# Patient Record
Sex: Female | Born: 1970 | Race: White | Hispanic: No | Marital: Married | Smoking: Never smoker
Health system: Southern US, Community
[De-identification: ages and names within clinical notes are randomized; demographics above are authoritative.]

## PROBLEM LIST (undated history)

## (undated) DIAGNOSIS — J45909 Unspecified asthma, uncomplicated: Secondary | ICD-10-CM

## (undated) DIAGNOSIS — J302 Other seasonal allergic rhinitis: Secondary | ICD-10-CM

## (undated) DIAGNOSIS — R55 Syncope and collapse: Secondary | ICD-10-CM

## (undated) DIAGNOSIS — I456 Pre-excitation syndrome: Secondary | ICD-10-CM

## (undated) DIAGNOSIS — R0600 Dyspnea, unspecified: Secondary | ICD-10-CM

## (undated) DIAGNOSIS — E039 Hypothyroidism, unspecified: Secondary | ICD-10-CM

## (undated) DIAGNOSIS — R600 Localized edema: Secondary | ICD-10-CM

## (undated) DIAGNOSIS — E079 Disorder of thyroid, unspecified: Secondary | ICD-10-CM

## (undated) DIAGNOSIS — G8929 Other chronic pain: Secondary | ICD-10-CM

## (undated) DIAGNOSIS — R0602 Shortness of breath: Secondary | ICD-10-CM

## (undated) DIAGNOSIS — R42 Dizziness and giddiness: Secondary | ICD-10-CM

## (undated) DIAGNOSIS — R51 Headache: Secondary | ICD-10-CM

## (undated) DIAGNOSIS — R609 Edema, unspecified: Secondary | ICD-10-CM

## (undated) DIAGNOSIS — R5383 Other fatigue: Secondary | ICD-10-CM

## (undated) DIAGNOSIS — R000 Tachycardia, unspecified: Secondary | ICD-10-CM

## (undated) DIAGNOSIS — R002 Palpitations: Secondary | ICD-10-CM

## (undated) DIAGNOSIS — I10 Essential (primary) hypertension: Secondary | ICD-10-CM

## (undated) HISTORY — PX: TONSILLECTOMY: SUR1361

## (undated) HISTORY — DX: Dizziness and giddiness: R42

## (undated) HISTORY — DX: Pre-excitation syndrome: I45.6

## (undated) HISTORY — DX: Other chronic pain: G89.29

## (undated) HISTORY — DX: Dyspnea, unspecified: R06.00

## (undated) HISTORY — DX: Headache: R51

## (undated) HISTORY — PX: WISDOM TOOTH EXTRACTION: SHX21

## (undated) HISTORY — DX: Edema, unspecified: R60.9

## (undated) HISTORY — DX: Other fatigue: R53.83

## (undated) HISTORY — DX: Syncope and collapse: R55

## (undated) HISTORY — DX: Localized edema: R60.0

## (undated) HISTORY — DX: Tachycardia, unspecified: R00.0

## (undated) HISTORY — DX: Other seasonal allergic rhinitis: J30.2

## (undated) HISTORY — DX: Hypothyroidism, unspecified: E03.9

## (undated) HISTORY — DX: Disorder of thyroid, unspecified: E07.9

## (undated) HISTORY — DX: Shortness of breath: R06.02

## (undated) HISTORY — PX: ANKLE SURGERY: SHX546

## (undated) HISTORY — DX: Palpitations: R00.2

## (undated) HISTORY — PX: FOOT SURGERY: SHX648

---

## 2009-09-05 ENCOUNTER — Ambulatory Visit: Payer: Self-pay | Admitting: Cardiovascular Disease

## 2009-09-15 ENCOUNTER — Telehealth: Payer: Self-pay | Admitting: Cardiovascular Disease

## 2009-09-29 ENCOUNTER — Encounter: Payer: Self-pay | Admitting: Cardiology

## 2009-09-30 ENCOUNTER — Ambulatory Visit: Payer: Self-pay

## 2009-09-30 ENCOUNTER — Ambulatory Visit (HOSPITAL_COMMUNITY): Admission: RE | Admit: 2009-09-30 | Discharge: 2009-09-30 | Payer: Self-pay | Admitting: Cardiovascular Disease

## 2009-09-30 ENCOUNTER — Ambulatory Visit: Payer: Self-pay | Admitting: Cardiology

## 2009-09-30 ENCOUNTER — Ambulatory Visit: Payer: Self-pay | Admitting: Internal Medicine

## 2009-09-30 ENCOUNTER — Encounter: Payer: Self-pay | Admitting: Cardiovascular Disease

## 2010-02-28 NOTE — Miscellaneous (Signed)
  Clinical Lists Changes  Observations: Added new observation of REFERRING MD: Lorine Bears, MD (09/30/2009 15:41)

## 2010-02-28 NOTE — Progress Notes (Signed)
  Phone Note Outgoing Call   Call placed by: Dessie Coma  LPN,  September 15, 2009 2:01 PM Call placed to: Patient Summary of Call: [facility] notified patient per Dr. Kirke Corin that labs were OK.

## 2010-02-28 NOTE — Miscellaneous (Signed)
  Clinical Lists Changes  Observations: Added new observation of PAST MED HX: Palpitations Dizziness Dyspnea and fatigue Question of history of WPW on EKG in the past with no arrhythmia seen during a nuclear study in 2007 ( PR interval was short but no clear delta wave) Hypothyroidism Edema (09/29/2009 15:26)       Past History:  Past Medical History: Palpitations Dizziness Dyspnea and fatigue Question of history of WPW on EKG in the past with no arrhythmia seen during a nuclear study in 2007 ( PR interval was short but no clear delta wave) Hypothyroidism Edema

## 2010-06-13 NOTE — Assessment & Plan Note (Signed)
[facility]                        [facility] OFFICE NOTE   Judy, Eaton                MRN:          [id number]  DATE:09/05/2009                            DOB:          1970/09/11    HISTORY:  Judy Eaton is a 40 year old female who is here today for  evaluation of palpitations and dizziness as well as mild dyspnea and  fatigue.  She has questionable history of Wolff-Parkinson-White pattern  on her EKG without having any previously documented arrhythmia.  This  was detected in 2007 during a nuclear stress test.  At that time, her  baseline EKG showed short PR interval, although there was no clear delta  wave at that time.  She had initial ST depression with exercise in the  inferior and anterolateral leads but these changes actually improved  with further continuation of her stress test.  Her stress test at that  time was not suggestive of any ischemia or infarct.  She is here today  for recent episodes of palpitations.  She had two episodes of fast  heartbeats where she felt to her heart was going very fast with  palpitations, feeling radiating to her neck.  This happened while she  was spending sometime with her friends.  She had more alcohol than the  usual and had also extra caffeine intake before the episode.  The  episode lasted for about 30 minutes and could not be relieved with the  vagal maneuvers.  She felt dizzy, but had no presyncope or syncope.  She  has been also having symptoms of generalized fatigue and occasional  dyspnea.  She has lost weight recently as she has been on special diet.   PAST MEDICAL HISTORY:  1. Questionable history of Wolff-Parkinson-White pattern on her EKG      without having the syndrome.  2. Hypothyroidism.  3. Lower extremity edema for which she takes a diuretic.  She does not      have history of hypertension.   MEDICATIONS:  1. Yasmin  2. Singulair.  3. Synthroid 150 mcg  once daily.  4. Triamterene/hydrochlorothiazide 37.5/25 mg once daily.   ALLERGIES:  No known drug allergies.   SOCIAL HISTORY:  Negative for smoking or recreational drug use.  She  drinks alcohol occasionally.  Usually three drinks per week on weekends.  However, recently she had extra drinks during celebration of her  birthday.  She exercises 3-4 times a week.  She works as a Event organiser.   PAST SURGICAL HISTORY:  1. C-section x2.  2. Tonsillectomy.   FAMILY HISTORY:  She is not sure exactly about her family history, but  there is no reported premature coronary artery disease or arrhythmia.   REVIEW OF SYSTEMS:  Remarkable for dyspnea, fatigue, palpitations, and  dizziness.  Full review of system was performed and is otherwise  negative.   PHYSICAL EXAMINATION:  GENERAL:  She is pleasant and in no acute  distress.  VITAL SIGNS:  Weight is 191.4 pounds, blood pressure is 124/79, pulse is  64, oxygen saturation is 96% on room air.  NECK:  No JVD or carotid bruits.  There is no thyromegaly or masses.  RESPIRATORY:  Normal respiratory effort with no use of accessory  muscles.  Auscultation reveals normal breath sounds with no crackles or  wheezing.  CARDIOVASCULAR:  Normal PMI.  Normal S1 and S2 with no gallops or  murmurs.  ABDOMEN:  Benign, nontender, nondistended.  EXTREMITIES:  With no clubbing, cyanosis, or edema.   Electrocardiogram was done which showed sinus rhythm with borderline  short PR interval.  QT interval is normal.  I do not see clear signs of  delta waves.   IMPRESSION:  Palpitations which happened twice recently.  The history of  suggestive of possible supraventricular tachycardia which might have  been triggered by excessive alcohol and caffeine intake.  Generally her  episodes are rare and most likely will not be able to catch these on a  Holter monitor.  She is also having generalized symptoms of fatigue and  occasional dyspnea and not  feeling well.  I think it will be important  to rule out some underlying metabolic abnormalities.  Thus I recommended  routine laboratory work which includes CBC, CMP, and checking thyroid  function.  Although I do not see clear evidence of Wolff-Parkinson-White  pattern on her EKG.  I reviewed the old EKGs as well and it is not  convincing.  She does have a short PR interval and occasionally a delta  wave is only present on a certain heart rate.  Due to this and due to  her symptoms of shortness of breath, I will recommend a treadmill stress  test to evaluate heart rate response to exercise as well as to see if we  can unmask any delta wave at higher heart rate.  We will also obtain an  echocardiogram for further evaluation.  If she gets any further episodes  of tachycardia, will likely need a prolonged monitoring with a 30-day  monitor.  In the meantime, I advised her to cut down on caffeine and  alcohol intake.  She will be notified with results of the workup.     Lorine Bears, MD  Electronically Signed    MA/MedQ  DD: 09/05/2009  DT: 09/06/2009  Job #: [id number]

## 2010-08-28 ENCOUNTER — Encounter: Payer: Self-pay | Admitting: Cardiovascular Disease

## 2010-10-19 ENCOUNTER — Encounter: Payer: Self-pay | Admitting: Cardiovascular Disease

## 2010-12-06 ENCOUNTER — Encounter: Payer: Self-pay | Admitting: Cardiovascular Disease

## 2012-10-23 DIAGNOSIS — G5761 Lesion of plantar nerve, right lower limb: Secondary | ICD-10-CM | POA: Insufficient documentation

## 2012-10-23 DIAGNOSIS — M2040 Other hammer toe(s) (acquired), unspecified foot: Secondary | ICD-10-CM | POA: Insufficient documentation

## 2014-01-19 ENCOUNTER — Ambulatory Visit: Payer: Self-pay | Admitting: Physician Assistant

## 2014-01-19 LAB — RAPID STREP-A WITH REFLX: Micro Text Report: NEGATIVE

## 2014-01-22 LAB — BETA STREP CULTURE(ARMC)

## 2014-09-27 DIAGNOSIS — M1710 Unilateral primary osteoarthritis, unspecified knee: Secondary | ICD-10-CM | POA: Insufficient documentation

## 2014-12-09 ENCOUNTER — Encounter: Payer: Self-pay | Admitting: Emergency Medicine

## 2014-12-09 ENCOUNTER — Ambulatory Visit
Admission: EM | Admit: 2014-12-09 | Discharge: 2014-12-09 | Disposition: A | Discharge status: Home / Self Care | Payer: BLUE CROSS/BLUE SHIELD | Attending: Family Medicine | Admitting: Family Medicine

## 2014-12-09 DIAGNOSIS — R070 Pain in throat: Secondary | ICD-10-CM

## 2014-12-09 LAB — RAPID STREP SCREEN (MED CTR MEBANE ONLY): Streptococcus, Group A Screen (Direct): NEGATIVE

## 2014-12-09 MED ORDER — PREDNISONE 20 MG PO TABS: ORAL_TABLET | ORAL | Status: DC

## 2014-12-09 NOTE — ED Notes (Signed)
Patient c/o sore throat for 4 days.

## 2014-12-09 NOTE — ED Provider Notes (Signed)
CSN: [id number]     Arrival date & time 12/09/14  1617 History   First MD Initiated Contact with Patient 12/09/14 1644     Chief Complaint  Patient presents with  . Sore Throat   (Consider location/radiation/quality/duration/timing/severity/associated sxs/prior Treatment) HPI   This a 44 year old female who presents with a sore throat that she's had for 4 days. States that she has had no fever although she's been chilled has abstained no sinus issues. 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 Now she has pain when she swallows and she is also very tender in the sternocleidomastoid muscles. Does not Complain of any pain with neck motion. She further states that one of the children at the theater group did have a strep throat and she has had strep throat on many occasions in the past.  Past Medical History  Diagnosis Date  . Palpitations   . Dizziness   . Dyspnea   . Fatigue   . WPW (Wolff-Parkinson-White syndrome)     question of it on EKG in past no arrhythmia seen during a nuclear test in 2007. (PR interval was short but no clear delta wave)  . Hypothyroidism   . Edema   . Rapid heart rate   . Chronic headaches   . Lightheadedness   . SOB (shortness of breath)   . Dizziness   . Fainting   . Seasonal allergies     Hay fever  . Thyroid disease   . Lower extremity edema    Past Surgical History  Procedure Laterality Date  . Cesarean section  1999/2000    x2  . Tonsillectomy    . Wisdom tooth extraction     History reviewed. No pertinent family history. Social History  Substance Use Topics  . Smoking status: Never Smoker   . Smokeless tobacco: None  . Alcohol Use: 1.5 oz/week    3 drink(s) per week     Comment: Weekends   OB History    No data available     Review of Systems  Constitutional: Positive for chills. Negative for fever, diaphoresis,  activity change, appetite change and fatigue.  HENT: Positive for sore throat and trouble swallowing. Negative for congestion, dental problem, drooling, ear discharge, ear pain, facial swelling, sinus pressure and sneezing.   Respiratory: Negative for cough and shortness of breath.   All other systems reviewed and are negative.   Allergies  Review of patient's allergies indicates no known allergies.  Home Medications   Prior to Admission medications   Medication Sig Start Date End Date Taking? Authorizing Provider  Drospirenone-Ethinyl Estradiol (YASMIN 28 PO) Take by mouth.      Historical Provider, MD  levothyroxine (SYNTHROID, LEVOTHROID) 150 MCG tablet Take 150 mcg by mouth daily.      Historical Provider, MD  Montelukast Sodium (SINGULAIR PO) Take by mouth.      Historical Provider, MD  predniSONE (DELTASONE) 20 MG tablet Take 2 tablets (40 mg) daily by mouth 12/09/14   Lutricia FeilWilliam P Gor Vestal, PA-C  triamterene-hydrochlorothiazide (DYAZIDE) 37.5-25 MG per capsule Take 1 capsule by mouth every morning.      Historical Provider, MD   Meds Ordered and Administered this Visit  Medications - No data to display  BP 111/78 mmHg  Pulse 78  Temp(Src) 97.7 F (36.5 C) (Tympanic)  Resp 16  Ht 5\' 6"  (1.676 m)  Wt  220 lb (99.791 kg)  BMI 35.53 kg/m2  SpO2 100%  LMP 12/06/2014 (Exact Date) No data found.   Physical Exam  Constitutional: She is oriented to person, place, and time. She appears well-developed and well-nourished. No distress.  HENT:  Head: Normocephalic and atraumatic.  Right Ear: External ear normal.  Left Ear: External ear normal.  Nose: Nose normal.  Mouth/Throat: No oropharyngeal exudate.  Eyes: Pupils are equal, round, and reactive to light. Right eye exhibits no discharge. Left eye exhibits no discharge.  Neck: Normal range of motion. Neck supple. No tracheal deviation present. Thyromegaly present.  Patient has tenderness to palpation along  both cleidomastoid  muscles. No adenopathy is appreciated. Range of motion of her neck is normal and full.  Pulmonary/Chest: Breath sounds normal. No stridor. No respiratory distress. She has no wheezes. She has no rales.  Musculoskeletal: Normal range of motion. She exhibits no edema or tenderness.  Lymphadenopathy:    She has no cervical adenopathy.  Neurological: She is alert and oriented to person, place, and time.  Skin: Skin is warm and dry. She is not diaphoretic.  Psychiatric: She has a normal mood and affect. Her behavior is normal. Judgment and thought content normal.  Nursing note and vitals reviewed.   ED Course  Procedures (including critical care time)  Labs Review Labs Reviewed  RAPID STREP SCREEN (NOT AT [facility])  CULTURE, GROUP A STREP ([facility] ONLY)    Imaging Review No results found.   Visual Acuity Review  Right Eye Distance:   Left Eye Distance:   Bilateral Distance:    Right Eye Near:   Left Eye Near:    Bilateral Near:         MDM   1. Throat pain in adult    New Prescriptions   PREDNISONE (DELTASONE) 20 MG TABLET    Take 2 tablets (40 mg) daily by mouth   Plan: 1. Test/x-ray results and diagnosis reviewed with patient 2. rx as per orders; risks, benefits, potential side effects reviewed with patient 3. Recommend supportive treatment with salt water gargles.  4. F/u PCP if not improving. Call 48 hours for C&S results.  Lutricia Feil, PA-C 12/09/14 1736

## 2014-12-11 LAB — CULTURE, GROUP A STREP (THRC)

## 2014-12-13 NOTE — ED Notes (Signed)
Final report of testing negative for infection

## 2014-12-14 ENCOUNTER — Ambulatory Visit
Admission: EM | Admit: 2014-12-14 | Discharge: 2014-12-14 | Disposition: A | Discharge status: Home / Self Care | Payer: BLUE CROSS/BLUE SHIELD | Attending: Family Medicine | Admitting: Family Medicine

## 2014-12-14 ENCOUNTER — Ambulatory Visit (INDEPENDENT_AMBULATORY_CARE_PROVIDER_SITE_OTHER): Payer: BLUE CROSS/BLUE SHIELD

## 2014-12-14 DIAGNOSIS — R059 Cough, unspecified: Secondary | ICD-10-CM

## 2014-12-14 DIAGNOSIS — J0110 Acute frontal sinusitis, unspecified: Secondary | ICD-10-CM | POA: Diagnosis not present

## 2014-12-14 DIAGNOSIS — R05 Cough: Secondary | ICD-10-CM

## 2014-12-14 MED ORDER — AMOXICILLIN 875 MG PO TABS: 875.0000 mg | ORAL_TABLET | Freq: Two times a day (BID) | ORAL | Status: DC

## 2014-12-14 MED ORDER — GUAIFENESIN-CODEINE 100-10 MG/5ML PO SOLN: ORAL | Status: DC

## 2014-12-14 NOTE — ED Provider Notes (Signed)
CSN: [id number]     Arrival date & time 12/14/14  1719 History   First MD Initiated Contact with Patient 12/14/14 1857     Chief Complaint  Patient presents with  . URI   (Consider location/radiation/quality/duration/timing/severity/associated sxs/prior Treatment) Patient is a 44 y.o. female presenting with URI. The history is provided by the patient.  URI Presenting symptoms: congestion, cough, ear pain, facial pain, fever and rhinorrhea   Severity:  Moderate Onset quality:  Sudden Duration:  10 days Timing:  Constant Progression:  Worsening Chronicity:  New Relieved by:  Nothing Ineffective treatments:  OTC medications Associated symptoms: headaches and sinus pain   Associated symptoms: no arthralgias, no myalgias, no neck pain, no sneezing and no wheezing     Past Medical History  Diagnosis Date  . Palpitations   . Dizziness   . Dyspnea   . Fatigue   . WPW (Wolff-Parkinson-White syndrome)     question of it on EKG in past no arrhythmia seen during a nuclear test in 2007. (PR interval was short but no clear delta wave)  . Hypothyroidism   . Edema   . Rapid heart rate   . Chronic headaches   . Lightheadedness   . SOB (shortness of breath)   . Dizziness   . Fainting   . Seasonal allergies     Hay fever  . Thyroid disease   . Lower extremity edema    Past Surgical History  Procedure Laterality Date  . Cesarean section  1999/2000    x2  . Tonsillectomy    . Wisdom tooth extraction     History reviewed. No pertinent family history. Social History  Substance Use Topics  . Smoking status: Never Smoker   . Smokeless tobacco: None  . Alcohol Use: 1.5 oz/week    3 drink(s) per week     Comment: Weekends   OB History    No data available     Review of Systems  Constitutional: Positive for fever.  HENT: Positive for congestion, ear pain and rhinorrhea. Negative for sneezing.   Respiratory: Positive for cough. Negative for wheezing.   Musculoskeletal:  Negative for myalgias, arthralgias and neck pain.  Neurological: Positive for headaches.    Allergies  Review of patient's allergies indicates no known allergies.  Home Medications   Prior to Admission medications   Medication Sig Start Date End Date Taking? Authorizing Provider  DM-Doxylamine-Acetaminophen (NYQUIL COLD & FLU PO) Take by mouth.   Yes Historical Provider, MD  levothyroxine (SYNTHROID, LEVOTHROID) 150 MCG tablet Take 150 mcg by mouth daily.     Yes Historical Provider, MD  triamterene-hydrochlorothiazide (DYAZIDE) 37.5-25 MG per capsule Take 1 capsule by mouth every morning.     Yes Historical Provider, MD  amoxicillin (AMOXIL) 875 MG tablet Take 1 tablet (875 mg total) by mouth 2 (two) times daily. 12/14/14   Payton Mccallum, MD  Drospirenone-Ethinyl Estradiol (YASMIN 28 PO) Take by mouth.      Historical Provider, MD  guaiFENesin-codeine 100-10 MG/5ML syrup 5-10 ml po qhs prn cough 12/14/14   Payton Mccallum, MD  Montelukast Sodium (SINGULAIR PO) Take by mouth.      Historical Provider, MD  predniSONE (DELTASONE) 20 MG tablet Take 2 tablets (40 mg) daily by mouth 12/09/14   Lutricia Feil, PA-C   Meds Ordered and Administered this Visit  Medications - No data to display  BP 110/97 mmHg  Pulse 105  Temp(Src) 98.5 F (36.9 C) (Tympanic)  Resp 16  Ht  5\' 6"  (1.676 m)  Wt 220 lb (99.791 kg)  BMI 35.53 kg/m2  SpO2 97%  LMP 12/06/2014 (Approximate) No data found.   Physical Exam  ED Course  Procedures (including critical care time)  Labs Review Labs Reviewed - No data to display  Imaging Review Dg Chest 2 View  12/14/2014  CLINICAL DATA:  Productive cough. EXAM: CHEST  2 VIEW COMPARISON:  None. FINDINGS: Normal heart size. Normal mediastinal contour. No pneumothorax. No pleural effusion. Clear lungs, with no focal lung consolidation and no pulmonary edema. IMPRESSION: No active cardiopulmonary disease. Electronically Signed   By: Delbert PhenixJason A Poff M.D.   On:  12/14/2014 19:05     Visual Acuity Review  Right Eye Distance:   Left Eye Distance:   Bilateral Distance:    Right Eye Near:   Left Eye Near:    Bilateral Near:         MDM   1. Acute frontal sinusitis, recurrence not specified   2. Cough     Discharge Medication List as of 12/14/2014  8:38 PM    START taking these medications   Details  amoxicillin (AMOXIL) 875 MG tablet Take 1 tablet (875 mg total) by mouth 2 (two) times daily., Starting 12/14/2014, Until Discontinued, Print    guaiFENesin-codeine 100-10 MG/5ML syrup 5-10 ml po qhs prn cough, Print      1. x-ray results and diagnosis reviewed with patient 2. rx as per orders above; reviewed possible side effects, interactions, risks and benefits  3. Recommend supportive treatment with otc nasal steroid spray, otc analgesics prn 4. Follow-up prn if symptoms worsen or don't improve    Payton Mccallumrlando Royce Sciara, MD 12/14/14 2156

## 2014-12-14 NOTE — ED Notes (Addendum)
Started 1 1/2 weeks with sore throat. [facility] and given Rx for Prednisone. Continues to have laryngitis and sore throat, right ear pain and headache.

## 2017-01-06 IMAGING — CR DG CHEST 2V
3 series · 3 of 3 positions shown · non-contrast
Comparison: None.

CLINICAL DATA: Productive cough.

EXAM:
CHEST  2 VIEW

[chest pa (1 of 2)]
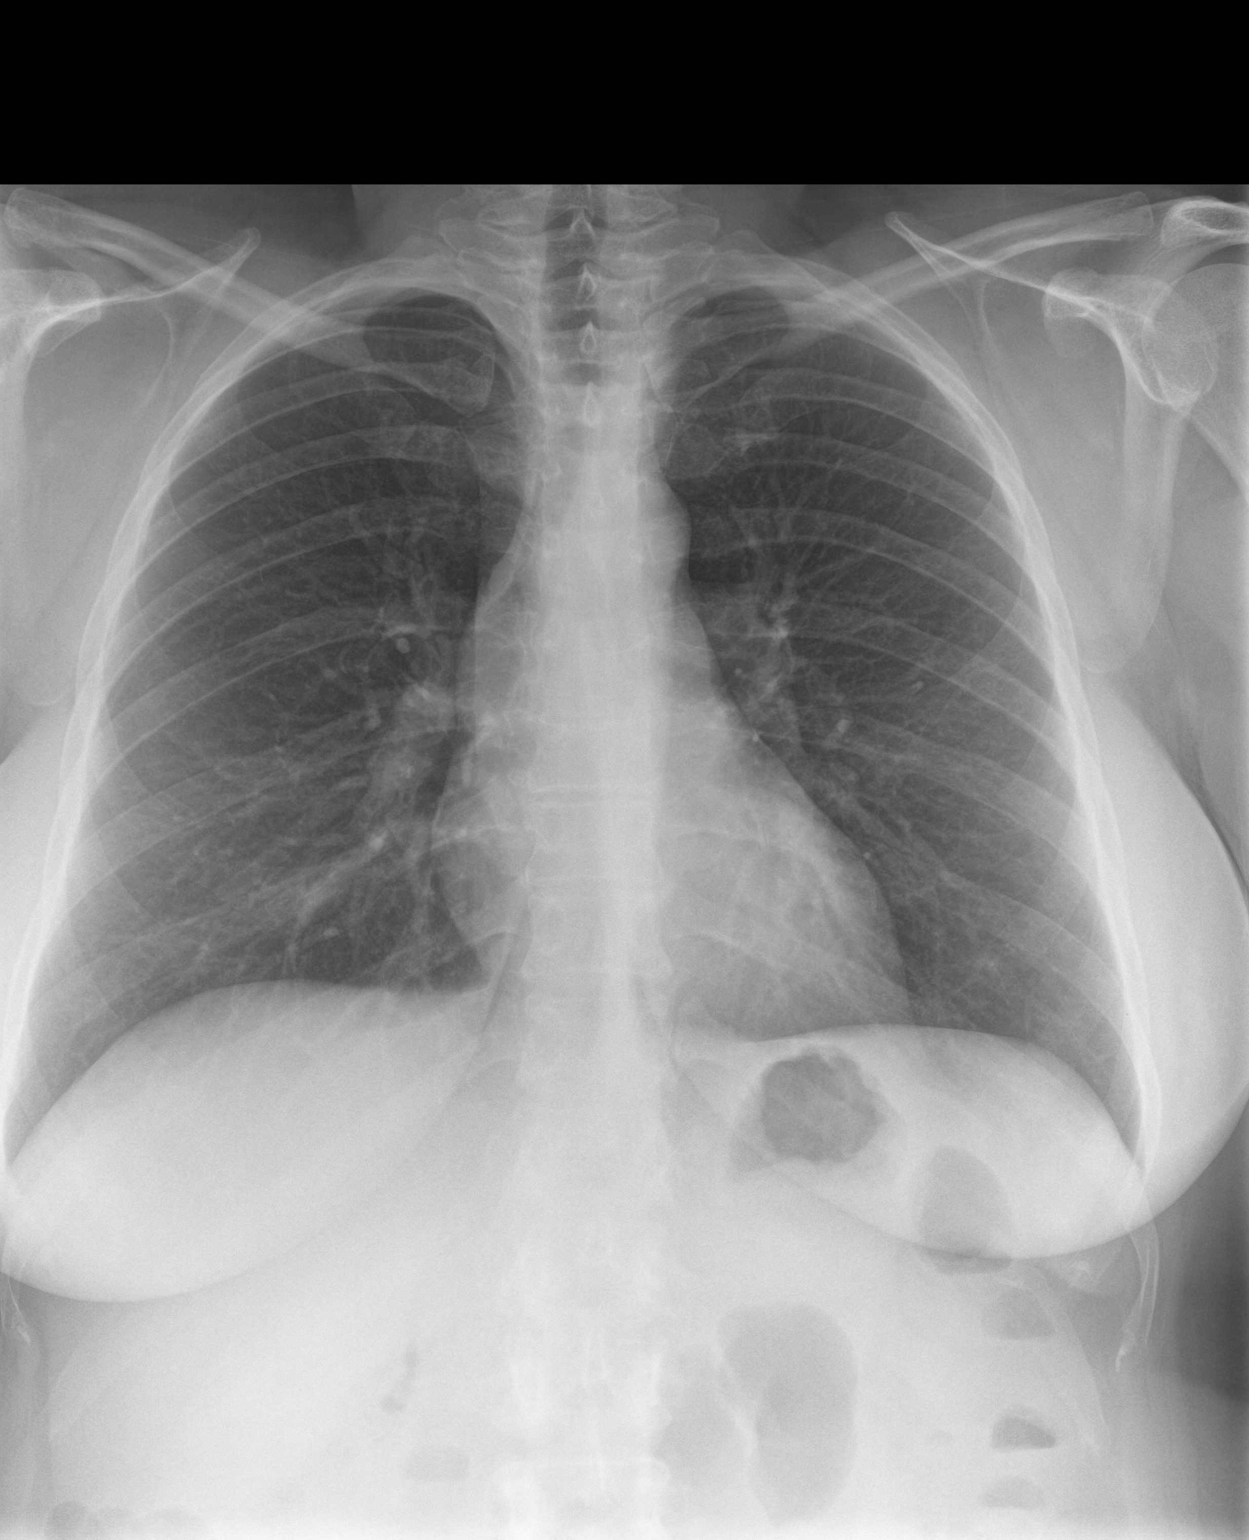

[chest lat]
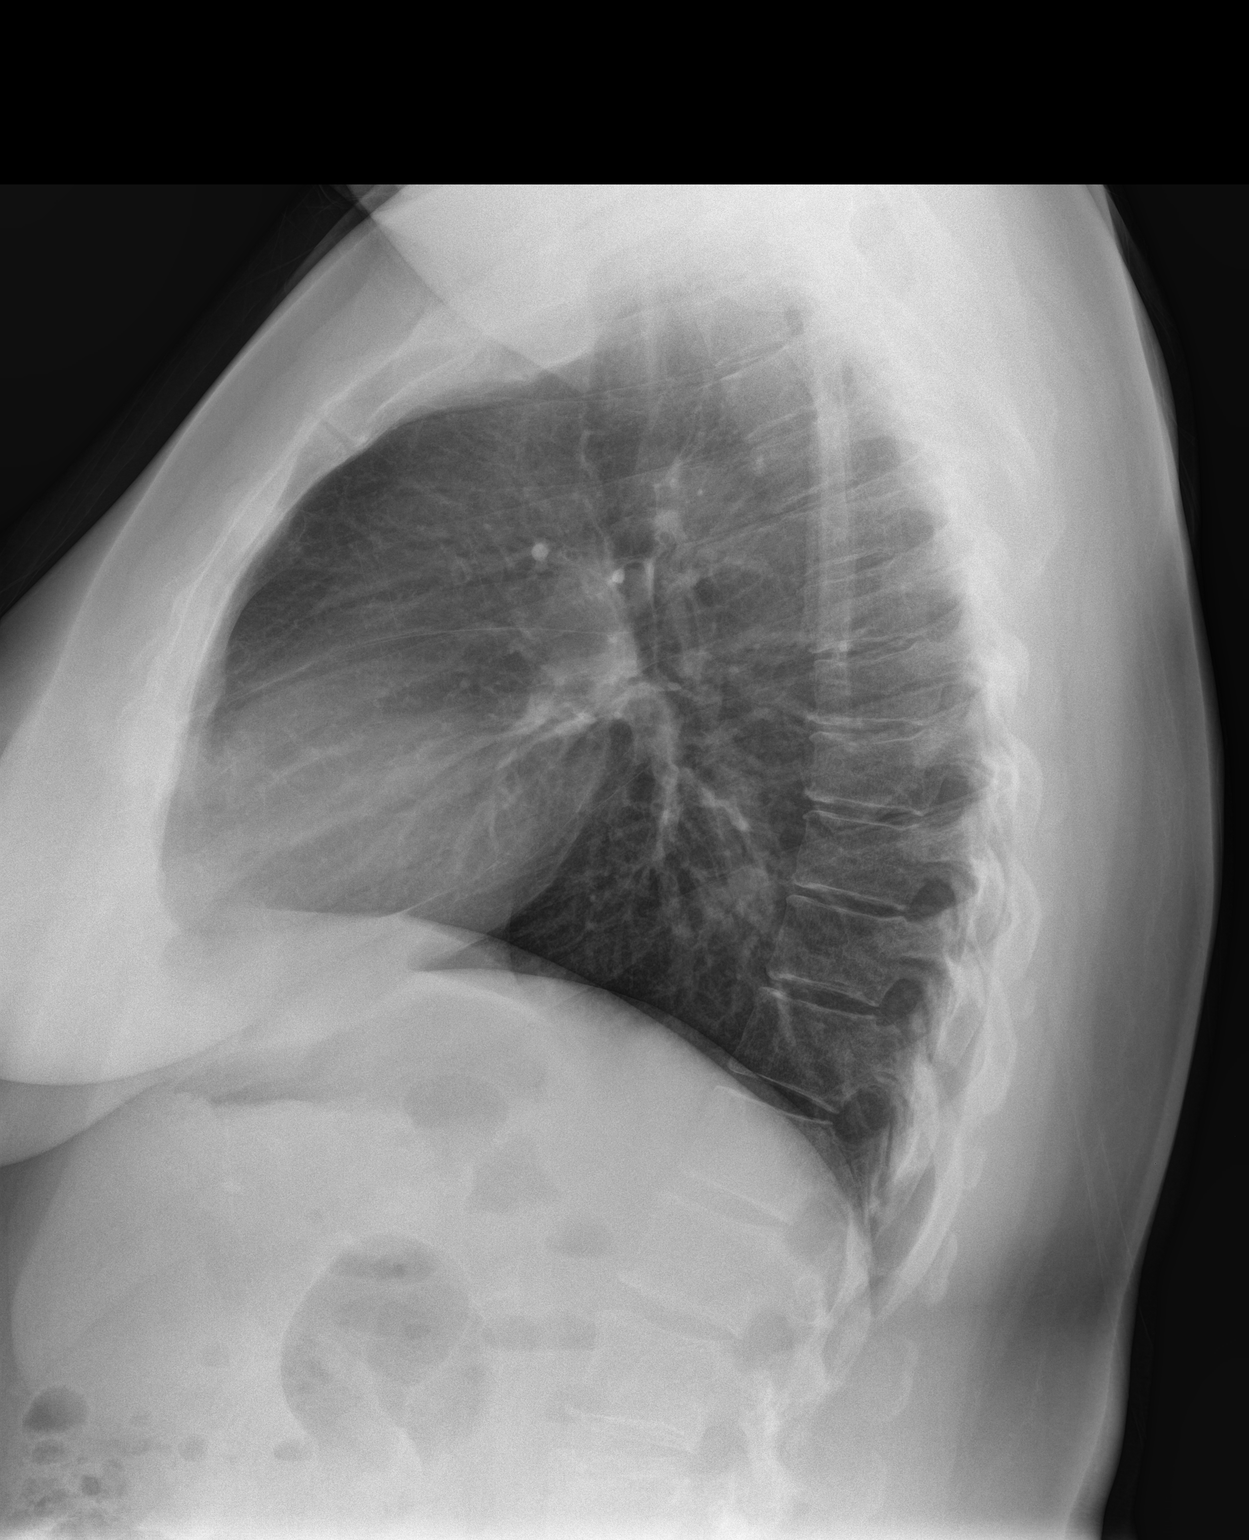

[chest pa (2 of 2)]
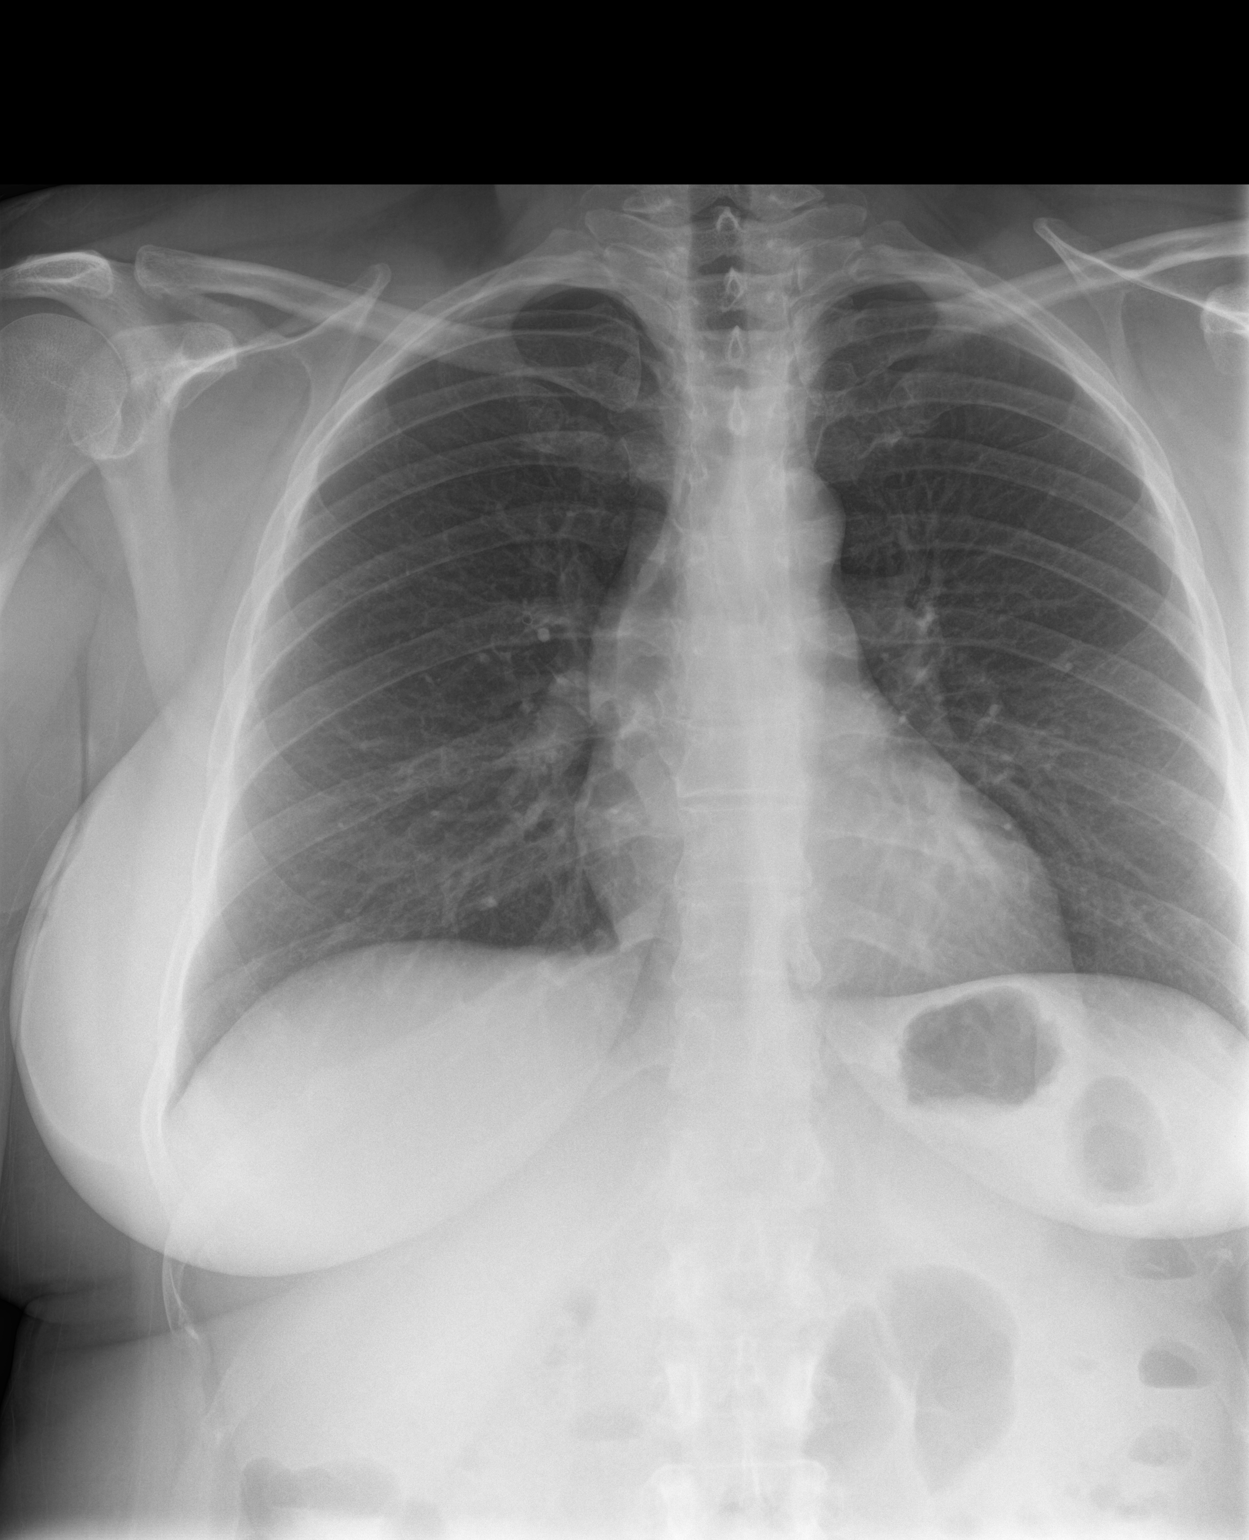

[3 of 3 positions shown; findings below may reference images not displayed]

FINDINGS: Normal heart size. Normal mediastinal contour. No pneumothorax. No
pleural effusion. Clear lungs, with no focal lung consolidation and
no pulmonary edema.
IMPRESSION: No active cardiopulmonary disease.

## 2017-04-29 DIAGNOSIS — J3089 Other allergic rhinitis: Secondary | ICD-10-CM | POA: Insufficient documentation

## 2017-04-29 DIAGNOSIS — E669 Obesity, unspecified: Secondary | ICD-10-CM | POA: Insufficient documentation

## 2017-04-29 DIAGNOSIS — R7302 Impaired glucose tolerance (oral): Secondary | ICD-10-CM | POA: Insufficient documentation

## 2017-04-29 DIAGNOSIS — J45990 Exercise induced bronchospasm: Secondary | ICD-10-CM | POA: Insufficient documentation

## 2019-11-17 ENCOUNTER — Telehealth (INDEPENDENT_AMBULATORY_CARE_PROVIDER_SITE_OTHER): Payer: Self-pay | Admitting: Gastroenterology

## 2019-11-17 ENCOUNTER — Other Ambulatory Visit: Payer: Self-pay

## 2019-11-17 DIAGNOSIS — Z1211 Encounter for screening for malignant neoplasm of colon: Secondary | ICD-10-CM

## 2019-11-17 DIAGNOSIS — E039 Hypothyroidism, unspecified: Secondary | ICD-10-CM | POA: Insufficient documentation

## 2019-11-17 MED ORDER — NA SULFATE-K SULFATE-MG SULF 17.5-3.13-1.6 GM/177ML PO SOLN: 1.0000 | Freq: Once | ORAL | 0 refills | Status: AC

## 2019-11-17 NOTE — Progress Notes (Signed)
Gastroenterology Pre-Procedure Review  Request Date: Thursday 12/03/19 Requesting Physician: Dr. Allen Norris  PATIENT REVIEW QUESTIONS: The patient responded to the following health history questions as indicated:    1. Are you having any GI issues? no 2. Do you have a personal history of Polyps? no 3. Do you have a family history of Colon Cancer or Polyps? no 4. Diabetes Mellitus? no 5. Joint replacements in the past 12 months?no 6. Major health problems in the past 3 months?no 7. Any artificial heart valves, MVP, or defibrillator?no    MEDICATIONS & ALLERGIES:    Patient reports the following regarding taking any anticoagulation/antiplatelet therapy:   Plavix, Coumadin, Eliquis, Xarelto, Lovenox, Pradaxa, Brilinta, or Effient? no Aspirin? no  Patient confirms/reports the following medications:  Current Outpatient Medications  Medication Sig Dispense Refill  . albuterol (VENTOLIN HFA) 108 (90 Base) MCG/ACT inhaler Inhale into the lungs.    Marland Kitchen buPROPion (WELLBUTRIN XL) 150 MG 24 hr tablet Take by mouth.    . busPIRone (BUSPAR) 5 MG tablet     . furosemide (LASIX) 20 MG tablet Take by mouth.    . Montelukast Sodium (SINGULAIR PO) Take by mouth.      . SYNTHROID 112 MCG tablet Take 112 mcg by mouth daily.    . Na Sulfate-K Sulfate-Mg Sulf 17.5-3.13-1.6 GM/177ML SOLN Take 1 kit by mouth once for 1 dose. 354 mL 0   No current facility-administered medications for this visit.    Patient confirms/reports the following allergies:  No Known Allergies  No orders of the defined types were placed in this encounter.   AUTHORIZATION INFORMATION Primary Insurance: 1D#: Group #:  Secondary Insurance: 1D#: Group #:  SCHEDULE INFORMATION: Date: 12/03/19 Time: Location:[facility]

## 2019-11-18 ENCOUNTER — Other Ambulatory Visit: Payer: Self-pay

## 2019-11-18 MED ORDER — PEG 3350-KCL-NA BICARB-NACL 420 G PO SOLR: 4000.0000 mL | Freq: Once | ORAL | 0 refills | Status: AC

## 2019-11-30 ENCOUNTER — Other Ambulatory Visit: Payer: Self-pay

## 2019-11-30 ENCOUNTER — Encounter: Payer: Self-pay | Admitting: Gastroenterology

## 2019-12-01 ENCOUNTER — Other Ambulatory Visit: Payer: Self-pay

## 2019-12-01 ENCOUNTER — Other Ambulatory Visit
Admission: RE | Admit: 2019-12-01 | Discharge: 2019-12-01 | Disposition: A | Discharge status: Home / Self Care | Payer: BC Managed Care – PPO | Source: Ambulatory Visit | Attending: Gastroenterology | Admitting: Gastroenterology

## 2019-12-01 DIAGNOSIS — Z01812 Encounter for preprocedural laboratory examination: Secondary | ICD-10-CM | POA: Insufficient documentation

## 2019-12-01 DIAGNOSIS — Z20822 Contact with and (suspected) exposure to covid-19: Secondary | ICD-10-CM | POA: Insufficient documentation

## 2019-12-01 LAB — SARS CORONAVIRUS 2 (TAT 6-24 HRS): SARS Coronavirus 2: NEGATIVE

## 2019-12-02 NOTE — Discharge Instructions (Signed)
General Anesthesia, Adult, Care After This sheet gives you information about how to care for yourself after your procedure. Your health care provider may also give you more specific instructions. If you have problems or questions, contact your health care provider. What can I expect after the procedure? After the procedure, the following side effects are common:  Pain or discomfort at the IV site.  Nausea.  Vomiting.  Sore throat.  Trouble concentrating.  Feeling cold or chills.  Weak or tired.  Sleepiness and fatigue.  Soreness and body aches. These side effects can affect parts of the body that were not involved in surgery. Follow these instructions at home:  For at least 24 hours after the procedure:  Have a responsible adult stay with you. It is important to have someone help care for you until you are awake and alert.  Rest as needed.  Do not: ? Participate in activities in which you could fall or become injured. ? Drive. ? Use heavy machinery. ? Drink alcohol. ? Take sleeping pills or medicines that cause drowsiness. ? Make important decisions or sign legal documents. ? Take care of children on your own. Eating and drinking  Follow any instructions from your health care provider about eating or drinking restrictions.  When you feel hungry, start by eating small amounts of foods that are soft and easy to digest (bland), such as toast. Gradually return to your regular diet.  Drink enough fluid to keep your urine pale yellow.  If you vomit, rehydrate by drinking water, juice, or clear broth. General instructions  If you have sleep apnea, surgery and certain medicines can increase your risk for breathing problems. Follow instructions from your health care provider about wearing your sleep device: ? Anytime you are sleeping, including during daytime naps. ? While taking prescription pain medicines, sleeping medicines, or medicines that make you drowsy.  Return to  your normal activities as told by your health care provider. Ask your health care provider what activities are safe for you.  Take over-the-counter and prescription medicines only as told by your health care provider.  If you smoke, do not smoke without supervision.  Keep all follow-up visits as told by your health care provider. This is important. Contact a health care provider if:  You have nausea or vomiting that does not get better with medicine.  You cannot eat or drink without vomiting.  You have pain that does not get better with medicine.  You are unable to pass urine.  You develop a skin rash.  You have a fever.  You have redness around your IV site that gets worse. Get help right away if:  You have difficulty breathing.  You have chest pain.  You have blood in your urine or stool, or you vomit blood. Summary  After the procedure, it is common to have a sore throat or nausea. It is also common to feel tired.  Have a responsible adult stay with you for the first 24 hours after general anesthesia. It is important to have someone help care for you until you are awake and alert.  When you feel hungry, start by eating small amounts of foods that are soft and easy to digest (bland), such as toast. Gradually return to your regular diet.  Drink enough fluid to keep your urine pale yellow.  Return to your normal activities as told by your health care provider. Ask your health care provider what activities are safe for you. This information is not   intended to replace advice given to you by your health care provider. Make sure you discuss any questions you have with your health care provider. Document Revised: 01/18/2017 Document Reviewed: 08/31/2016 Elsevier Patient Education  2020 [employer]  

## 2019-12-03 ENCOUNTER — Encounter
Admission: RE | Disposition: A | Discharge status: Home / Self Care | Payer: Self-pay | Source: Home / Self Care | Attending: Gastroenterology

## 2019-12-03 ENCOUNTER — Other Ambulatory Visit: Payer: Self-pay

## 2019-12-03 ENCOUNTER — Ambulatory Visit: Payer: BC Managed Care – PPO | Admitting: Anesthesiology

## 2019-12-03 ENCOUNTER — Encounter: Payer: Self-pay | Admitting: Gastroenterology

## 2019-12-03 ENCOUNTER — Ambulatory Visit
Admission: RE | Admit: 2019-12-03 | Discharge: 2019-12-03 | Disposition: A | Discharge status: Home / Self Care | Payer: BC Managed Care – PPO | Attending: Gastroenterology | Admitting: Gastroenterology

## 2019-12-03 DIAGNOSIS — Z79890 Hormone replacement therapy (postmenopausal): Secondary | ICD-10-CM | POA: Diagnosis not present

## 2019-12-03 DIAGNOSIS — Z79899 Other long term (current) drug therapy: Secondary | ICD-10-CM | POA: Diagnosis not present

## 2019-12-03 DIAGNOSIS — Z1211 Encounter for screening for malignant neoplasm of colon: Secondary | ICD-10-CM | POA: Diagnosis present

## 2019-12-03 DIAGNOSIS — K640 First degree hemorrhoids: Secondary | ICD-10-CM | POA: Insufficient documentation

## 2019-12-03 HISTORY — DX: Essential (primary) hypertension: I10

## 2019-12-03 HISTORY — DX: Unspecified asthma, uncomplicated: J45.909

## 2019-12-03 HISTORY — PX: COLONOSCOPY WITH PROPOFOL: SHX5780

## 2019-12-03 LAB — POCT PREGNANCY, URINE: Preg Test, Ur: NEGATIVE

## 2019-12-03 SURGERY — COLONOSCOPY WITH PROPOFOL
Anesthesia: General

## 2019-12-03 MED ORDER — PROPOFOL 10 MG/ML IV BOLUS
INTRAVENOUS | Status: DC | PRN
  Administered 2019-12-03 (×2): 40 mg via INTRAVENOUS
  Administered 2019-12-03: 150 mg via INTRAVENOUS
  Administered 2019-12-03: 30 mg via INTRAVENOUS

## 2019-12-03 MED ORDER — STERILE WATER FOR IRRIGATION IR SOLN
Status: DC | PRN
  Administered 2019-12-03: 150 mL

## 2019-12-03 MED ORDER — ACETAMINOPHEN 160 MG/5ML PO SOLN: 325.0000 mg | ORAL | Status: DC | PRN

## 2019-12-03 MED ORDER — ONDANSETRON HCL 4 MG/2ML IJ SOLN: 4.0000 mg | Freq: Once | INTRAMUSCULAR | Status: DC | PRN

## 2019-12-03 MED ORDER — SODIUM CHLORIDE 0.9 % IV SOLN: INTRAVENOUS | Status: DC

## 2019-12-03 MED ORDER — LACTATED RINGERS IV SOLN: INTRAVENOUS | Status: DC

## 2019-12-03 MED ORDER — LIDOCAINE HCL (CARDIAC) PF 100 MG/5ML IV SOSY
PREFILLED_SYRINGE | INTRAVENOUS | Status: DC | PRN
  Administered 2019-12-03: 30 mg via INTRAVENOUS

## 2019-12-03 MED ORDER — ACETAMINOPHEN 325 MG PO TABS: 325.0000 mg | ORAL_TABLET | ORAL | Status: DC | PRN

## 2019-12-03 SURGICAL SUPPLY — 17 items
CLIP HMST 235XBRD CATH ROT (MISCELLANEOUS) IMPLANT
CLIP RESOLUTION 360 11X235 (MISCELLANEOUS)
FCP ESCP3.2XJMB 240X2.8X (MISCELLANEOUS)
FORCEPS BIOP RAD 4 LRG CAP 4 (CUTTING FORCEPS) IMPLANT
FORCEPS BIOP RJ4 240 W/NDL (MISCELLANEOUS)
FORCEPS ESCP3.2XJMB 240X2.8X (MISCELLANEOUS) IMPLANT
GOWN CVR UNV OPN BCK APRN NK (MISCELLANEOUS) ×2 IMPLANT
GOWN ISOL THUMB LOOP REG UNIV (MISCELLANEOUS) ×6
INJECTOR VARIJECT VIN23 (MISCELLANEOUS) IMPLANT
KIT DEFENDO VALVE AND CONN (KITS) IMPLANT
KIT PRC NS LF DISP ENDO (KITS) ×1 IMPLANT
KIT PROCEDURE OLYMPUS (KITS) ×3
MANIFOLD NEPTUNE II (INSTRUMENTS) ×3 IMPLANT
SNARE SHORT THROW 13M SML OVAL (MISCELLANEOUS) IMPLANT
SNARE SHORT THROW 30M LRG OVAL (MISCELLANEOUS) IMPLANT
VARIJECT INJECTOR VIN23 (MISCELLANEOUS)
WATER STERILE IRR 250ML POUR (IV SOLUTION) ×3 IMPLANT

## 2019-12-03 NOTE — Anesthesia Procedure Notes (Signed)
Date/Time: 12/03/2019 9:37 AM Performed by: Maree Krabbe, CRNA Pre-anesthesia Checklist: Patient identified, Emergency Drugs available, Suction available, Timeout performed and Patient being monitored Patient Re-evaluated:Patient Re-evaluated prior to induction Oxygen Delivery Method: Nasal cannula Placement Confirmation: positive ETCO2

## 2019-12-03 NOTE — Transfer of Care (Signed)
Immediate Anesthesia Transfer of Care Note  Patient: Judy Eaton  Procedure(s) Performed: COLONOSCOPY WITH PROPOFOL (N/A )  Patient Location: PACU  Anesthesia Type: General  Level of Consciousness: awake, alert  and patient cooperative  Airway and Oxygen Therapy: Patient Spontanous Breathing and Patient connected to supplemental oxygen  Post-op Assessment: Post-op Vital signs reviewed, Patient's Cardiovascular Status Stable, Respiratory Function Stable, Patent Airway and No signs of Nausea or vomiting  Post-op Vital Signs: Reviewed and stable  Complications: No complications documented.

## 2019-12-03 NOTE — Anesthesia Preprocedure Evaluation (Signed)
Anesthesia Evaluation  Patient identified by MRN, date of birth, ID band Patient awake    Reviewed: Allergy & Precautions, NPO status   Airway Mallampati: II  TM Distance: >3 FB     Dental   Pulmonary asthma (exercise induced) ,    breath sounds clear to auscultation       Cardiovascular hypertension,  Rhythm:Regular Rate:Normal     Neuro/Psych  Headaches,    GI/Hepatic   Endo/Other  Hypothyroidism Obesity - BMI 36  Renal/GU      Musculoskeletal  (+) Arthritis ,   Abdominal   Peds  Hematology   Anesthesia Other Findings   Reproductive/Obstetrics                            Anesthesia Physical Anesthesia Plan  ASA: II  Anesthesia Plan: General   Post-op Pain Management:    Induction: Intravenous  PONV Risk Score and Plan: Propofol infusion, TIVA and Treatment may vary due to age or medical condition  Airway Management Planned: Natural Airway and Nasal Cannula  Additional Equipment:   Intra-op Plan:   Post-operative Plan:   Informed Consent: I have reviewed the patients History and Physical, chart, labs and discussed the procedure including the risks, benefits and alternatives for the proposed anesthesia with the patient or authorized representative who has indicated his/her understanding and acceptance.       Plan Discussed with: CRNA  Anesthesia Plan Comments:         Anesthesia Quick Evaluation

## 2019-12-03 NOTE — H&P (Signed)
Judy Minium, MD [facility] [address], [address] [place], [place] [address] Phone: [phone] Fax : [phone]  Primary Care Physician:  Myrene Buddy, NP Primary Gastroenterologist:  Dr. Servando Snare  Pre-Procedure History & Physical: HPI:  Judy Eaton is a 49 y.o. female is here for a screening colonoscopy.   Past Medical History:  Diagnosis Date  . Asthma    exercise induced asthma  . Chronic headaches    migraines every couple of months  . Dizziness    vertigo  . Dizziness   . Dyspnea   . Edema   . Fainting   . Fatigue   . Hypertension   . Hypothyroidism   . Lightheadedness   . Lower extremity edema   . Palpitations   . Rapid heart rate   . Seasonal allergies    Hay fever  . SOB (shortness of breath)   . Thyroid disease   . WPW (Wolff-Parkinson-White syndrome)    question of it on EKG in past no arrhythmia seen during a nuclear test in 2007. (PR interval was short but no clear delta wave)    Past Surgical History:  Procedure Laterality Date  . ANKLE SURGERY Left   . CESAREAN SECTION  1999/2000   x2  . FOOT SURGERY Right    with hardware  . TONSILLECTOMY    . WISDOM TOOTH EXTRACTION      Prior to Admission medications   Medication Sig Start Date End Date Taking? Authorizing Provider  buPROPion (WELLBUTRIN XL) 150 MG 24 hr tablet Take by mouth daily. am 08/13/19  Yes [provider]  busPIRone (BUSPAR) 5 MG tablet 2 (two) times daily.  10/24/19  Yes [provider]  furosemide (LASIX) 20 MG tablet Take by mouth daily. am 08/13/19 08/12/20 Yes [provider]  Montelukast Sodium (SINGULAIR PO) Take 10 mg by mouth daily. am   Yes [provider]  Multiple Vitamin (MULTIVITAMIN) LIQD Take 5 mLs by mouth daily. gummy   Yes [provider]  SYNTHROID 112 MCG tablet Take 112 mcg by mouth daily before breakfast.  11/12/19  Yes [provider]  albuterol (VENTOLIN HFA) 108 (90 Base) MCG/ACT inhaler  Inhale into the lungs. 08/14/19 08/13/20  [provider]    Allergies as of 11/17/2019  . (No Known Allergies)    History reviewed. No pertinent family history.  Social History   Socioeconomic History  . Marital status: Married    Spouse name: Not on file  . Number of children: Not on file  . Years of education: Not on file  . Highest education level: Not on file  Occupational History  . Occupation: [employer] conservation officer  Tobacco Use  . Smoking status: Never Smoker  . Smokeless tobacco: Never Used  Substance and Sexual Activity  . Alcohol use: Yes    Alcohol/week: 3.0 standard drinks    Types: 3 Standard drinks or equivalent per week    Comment: Weekends  . Drug use: No  . Sexual activity: Not on file  Other Topics Concern  . Not on file  Social History Narrative   Drinks 2 caffeinated beverages per day   Exercises 3-4 times per week   Social Determinants of Health   Financial Resource Strain:   . Difficulty of Paying Living Expenses: Not on file  Food Insecurity:   . Worried About Programme researcher, broadcasting/film/video in the Last Year: Not on file  . Ran Out of Food in the Last Year: Not on  file  Transportation Needs:   . Freight forwarder (Medical): Not on file  . Lack of Transportation (Non-Medical): Not on file  Physical Activity:   . Days of Exercise per Week: Not on file  . Minutes of Exercise per Session: Not on file  Stress:   . Feeling of Stress : Not on file  Social Connections:   . Frequency of Communication with Friends and Family: Not on file  . Frequency of Social Gatherings with Friends and Family: Not on file  . Attends Religious Services: Not on file  . Active Member of Clubs or Organizations: Not on file  . Attends Banker Meetings: Not on file  . Marital Status: Not on file  Intimate Partner Violence:   . Fear of Current or Ex-Partner: Not on file  . Emotionally Abused: Not on file  . Physically Abused: Not on file  . Sexually  Abused: Not on file    Review of Systems: See HPI, otherwise negative ROS  Physical Exam: BP 128/82   Pulse 63   Temp 97.7 F (36.5 C) (Temporal)   Ht 5\' 5"  (1.651 m)   Wt 95.7 kg   LMP 11/18/2019 (Approximate)   SpO2 98%   BMI 35.11 kg/m  General:   Alert,  pleasant and cooperative in NAD Head:  Normocephalic and atraumatic. Neck:  Supple; no masses or thyromegaly. Lungs:  Clear throughout to auscultation.    Heart:  Regular rate and rhythm. Abdomen:  Soft, nontender and nondistended. Normal bowel sounds, without guarding, and without rebound.   Neurologic:  Alert and  oriented x4;  grossly normal neurologically.  Impression/Plan: Judy Eaton is now here to undergo a screening colonoscopy.  Risks, benefits, and alternatives regarding colonoscopy have been reviewed with the patient.  Questions have been answered.  All parties agreeable.

## 2019-12-03 NOTE — Op Note (Addendum)
[facility] Gastroenterology Patient Name: Judy Eaton Procedure Date: 12/03/2019 9:29 AM MRN: [id number] Account #: [id number] Date of Birth: 1970/05/06 Admit Type: Outpatient Age: 49 Room: [facility] OR ROOM 01 Gender: Female Note Status: Supervisor Override Procedure:             Colonoscopy Indications:           Screening for colorectal malignant neoplasm Providers:             Midge Minium MD, MD Referring MD:          Caryl Asp (Referring MD) Medicines:             Propofol per Anesthesia Complications:         No immediate complications. Procedure:             Pre-Anesthesia Assessment:                        - Prior to the procedure, a History and Physical was                         performed, and patient medications and allergies were                         reviewed. The patient's tolerance of previous                         anesthesia was also reviewed. The risks and benefits                         of the procedure and the sedation options and risks                         were discussed with the patient. All questions were                         answered, and informed consent was obtained. Prior                         Anticoagulants: The patient has taken no previous                         anticoagulant or antiplatelet agents. ASA Grade                         Assessment: II - A patient with mild systemic disease.                         After reviewing the risks and benefits, the patient                         was deemed in satisfactory condition to undergo the                         procedure.                        After obtaining informed consent, the colonoscope was  passed under direct vision. Throughout the procedure,                         the patient's blood pressure, pulse, and oxygen                         saturations were monitored continuously. The                         Colonoscope was  introduced through the anus and                         advanced to the the cecum, identified by appendiceal                         orifice and ileocecal valve. The colonoscopy was                         performed without difficulty. The patient tolerated                         the procedure well. The quality of the bowel                         preparation was excellent. Findings:      The perianal and digital rectal examinations were normal.      Non-bleeding internal hemorrhoids were found during retroflexion. The       hemorrhoids were Grade I (internal hemorrhoids that do not prolapse). Impression:            - Non-bleeding internal hemorrhoids.                        - No specimens collected. Recommendation:        - Discharge patient to home.                        - Resume previous diet.                        - Continue present medications.                        - Repeat colonoscopy in 10 years for screening                         purposes.                        - unless any change in family history or lower GI                         problems. Procedure Code(s):     --- Professional ---                        [phone], Colonoscopy, flexible; diagnostic, including                         collection of specimen(s) by brushing or washing, when  performed (separate procedure) Diagnosis Code(s):     --- Professional ---                        Z12.11, Encounter for screening for malignant neoplasm                         of colon CPT copyright 2019 [employer]. All rights reserved. The codes documented in this report are preliminary and upon coder review may  be revised to meet current compliance requirements. Midge Minium MD, MD 12/03/2019 9:58:43 AM This report has been signed electronically. Number of Addenda: 0 Note Initiated On: 12/03/2019 9:29 AM Scope Withdrawal Time: 0 hours 7 minutes 42 seconds  Total Procedure Duration: 0 hours  13 minutes 54 seconds  Estimated Blood Loss:  Estimated blood loss: none.      [facility]

## 2019-12-03 NOTE — Anesthesia Postprocedure Evaluation (Signed)
Anesthesia Post Note  Patient: Judy Eaton  Procedure(s) Performed: COLONOSCOPY WITH PROPOFOL (N/A )     Patient location during evaluation: PACU Anesthesia Type: General Level of consciousness: awake Pain management: pain level controlled Vital Signs Assessment: post-procedure vital signs reviewed and stable Respiratory status: respiratory function stable Cardiovascular status: stable Postop Assessment: no signs of nausea or vomiting Anesthetic complications: no   No complications documented.  Jola Babinski

## 2019-12-04 ENCOUNTER — Encounter: Payer: Self-pay | Admitting: Gastroenterology
